# Patient Record
Sex: Female | Born: 1995 | Race: White | Hispanic: No | Marital: Single | Smoking: Never smoker
Health system: Southern US, Community
[De-identification: ages and names within clinical notes are randomized; demographics above are authoritative.]

## PROBLEM LIST (undated history)

## (undated) DIAGNOSIS — J45909 Unspecified asthma, uncomplicated: Secondary | ICD-10-CM

## (undated) DIAGNOSIS — K219 Gastro-esophageal reflux disease without esophagitis: Secondary | ICD-10-CM

## (undated) DIAGNOSIS — R519 Headache, unspecified: Secondary | ICD-10-CM

## (undated) HISTORY — PX: WISDOM TOOTH EXTRACTION: SHX21

---

## 2018-03-06 ENCOUNTER — Ambulatory Visit (HOSPITAL_COMMUNITY)
Admission: EM | Admit: 2018-03-06 | Discharge: 2018-03-06 | Disposition: A | Discharge status: Home / Self Care | Payer: Commercial Managed Care - PPO | Attending: Internal Medicine | Admitting: Internal Medicine

## 2018-03-06 ENCOUNTER — Encounter (HOSPITAL_COMMUNITY): Payer: Self-pay | Admitting: Emergency Medicine

## 2018-03-06 DIAGNOSIS — B349 Viral infection, unspecified: Secondary | ICD-10-CM | POA: Diagnosis not present

## 2018-03-06 MED ORDER — SODIUM CHLORIDE 0.9 % IV BOLUS
1000.0000 mL | Freq: Once | INTRAVENOUS | Status: AC
  Administered 2018-03-06: 1000 mL via INTRAVENOUS

## 2018-03-06 MED ORDER — BENZONATATE 100 MG PO CAPS: 100.0000 mg | ORAL_CAPSULE | Freq: Three times a day (TID) | ORAL | 0 refills | Status: DC

## 2018-03-06 MED ORDER — ONDANSETRON 4 MG PO TBDP: 4.0000 mg | ORAL_TABLET | Freq: Three times a day (TID) | ORAL | 0 refills | Status: DC | PRN

## 2018-03-06 NOTE — ED Triage Notes (Signed)
Pt c/o cough Monday, felt weak yesterday, vomited, running a fever since yesterday.

## 2018-03-06 NOTE — ED Provider Notes (Signed)
[facility]    CSN: [id number] Arrival date & time: 03/06/18  1022     History   Chief Complaint Chief Complaint  Patient presents with  . Flu Like Symptoms    HPI Cassandra Murray is a 23 y.o. female history of environmental allergies, mild intermittent asthma comes to the urgent care on account of 3-day history of runny nose, cough, nausea and vomiting.  Cough is productive of clear sputum.  No wheezing.  No relieving factors.  Patient has tried her inhalers with partial improvement.  It is associated with low-grade fever without chills.  Patient had nausea and vomiting.  No diarrhea.  Oral intake has been poor.  Patient feels generally fatigued and dizzy with activity.  HPI  Past medical history: Bronchial asthma Allergic rhinitis  There are no active problems to display for this patient.   History reviewed. No pertinent surgical history.  OB History   No obstetric history on file.      Home Medications    Prior to Admission medications   Medication Sig Start Date End Date Taking? Authorizing Provider  albuterol (PROVENTIL HFA;VENTOLIN HFA) 108 (90 Base) MCG/ACT inhaler Inhale into the lungs every 6 (six) hours as needed for wheezing or shortness of breath.   Yes [provider]  cetirizine-pseudoephedrine (ZYRTEC-D) 5-120 MG tablet Take 1 tablet by mouth 2 (two) times daily.   Yes [provider]  cyclobenzaprine (FLEXERIL) 10 MG tablet Take 10 mg by mouth 3 (three) times daily as needed for muscle spasms.   Yes [provider]  fluticasone (FLONASE) 50 MCG/ACT nasal spray Place into both nostrils daily.   Yes [provider]  meloxicam (MOBIC) 7.5 MG tablet Take 7.5 mg by mouth daily.   Yes [provider]    Family History Family History  Problem Relation Age of Onset  . Healthy Mother   . Healthy Father     Social History Social History   Tobacco Use  . Smoking status: Never Smoker  Substance Use  Topics  . Alcohol use: Not Currently  . Drug use: Never     Allergies   Patient has no known allergies.   Review of Systems Review of Systems  Constitutional: Positive for activity change, appetite change and fatigue. Negative for chills and fever.  HENT: Positive for congestion and sneezing. Negative for ear discharge, ear pain, rhinorrhea, sinus pressure, sinus pain, sore throat and voice change.   Eyes: Negative for pain, discharge and itching.  Respiratory: Positive for cough. Negative for chest tightness, shortness of breath and wheezing.   Cardiovascular: Negative for chest pain and palpitations.  Gastrointestinal: Positive for nausea and vomiting. Negative for abdominal distention and diarrhea.  Endocrine: Negative for polyuria.  Genitourinary: Negative for difficulty urinating, dysuria, frequency and urgency.  Musculoskeletal: Negative for arthralgias, back pain, joint swelling and neck pain.  Allergic/Immunologic: Positive for environmental allergies. Negative for food allergies and immunocompromised state.  Neurological: Positive for dizziness and light-headedness. Negative for syncope and numbness.  Hematological: Negative for adenopathy. Does not bruise/bleed easily.     Physical Exam Triage Vital Signs ED Triage Vitals  Enc Vitals Group     BP 03/06/18 1113 121/66     Pulse Rate 03/06/18 1112 (!) 118     Resp 03/06/18 1112 20     Temp 03/06/18 1112 99.6 F (37.6 C)     Temp src --      SpO2 03/06/18 1112 97 %     Weight --  Height --      Head Circumference --      Peak Flow --      Pain Score 03/06/18 1113 4     Pain Loc --      Pain Edu? --      Excl. in [place]? --    No data found.  Updated Vital Signs BP 121/66   Pulse (!) 118   Temp 99.6 F (37.6 C)   Resp 20   LMP 03/03/2018   SpO2 97%   Visual Acuity Right Eye Distance:   Left Eye Distance:   Bilateral Distance:    Right Eye Near:   Left Eye Near:    Bilateral Near:     Physical  Exam Constitutional:      Appearance: She is ill-appearing.  HENT:     Right Ear: Tympanic membrane normal. There is no impacted cerumen.     Left Ear: Tympanic membrane normal.     Nose: Nose normal.     Mouth/Throat:     Mouth: Mucous membranes are moist.     Pharynx: Posterior oropharyngeal erythema present. No oropharyngeal exudate.  Eyes:     Extraocular Movements: Extraocular movements intact.     Conjunctiva/sclera: Conjunctivae normal.     Pupils: Pupils are equal, round, and reactive to light.  Neck:     Musculoskeletal: Normal range of motion. No neck rigidity.  Cardiovascular:     Rate and Rhythm: Tachycardia present.     Heart sounds: No murmur. No gallop.   Pulmonary:     Effort: Pulmonary effort is normal. No respiratory distress.     Breath sounds: No wheezing.  Abdominal:     General: Abdomen is flat. Bowel sounds are normal.     Palpations: Abdomen is soft.  Musculoskeletal: Normal range of motion.  Skin:    General: Skin is warm.     Capillary Refill: Capillary refill takes less than 2 seconds.  Neurological:     General: No focal deficit present.     Mental Status: She is alert and oriented to person, place, and time.      UC Treatments / Results  Labs (all labs ordered are listed, but only abnormal results are displayed) Labs Reviewed - No data to display  EKG None  Radiology No results found.  Procedures Procedures (including critical care time)  Medications Ordered in UC Medications  sodium chloride 0.9 % bolus 1,000 mL (has no administration in time range)    Initial Impression / Assessment and Plan / UC Course  I have reviewed the triage vital signs and the nursing notes.  Pertinent labs & imaging results that were available during my care of the patient were reviewed by me and considered in my medical decision making (see chart for details).     1.  Viral syndrome with vomiting: Zofran ODT Encourage oral fluid intake   2.   Dizziness secondary to dehydration/orthostasis: Normal saline bolus-1 L Encourage oral fluid intake  3.  Mild intermittent asthma with mild exacerbation: Continue bronchodilator treatments No indication for steroid use at this type  Final Clinical Impressions(s) / UC Diagnoses   Final diagnoses:  None   Discharge Instructions   None    ED Prescriptions    None     Controlled Substance Prescriptions [place] Controlled Substance Registry consulted? No   Merrilee Jansky, MD 03/06/18 1148

## 2018-11-27 ENCOUNTER — Ambulatory Visit (HOSPITAL_COMMUNITY)
Admission: EM | Admit: 2018-11-27 | Discharge: 2018-11-27 | Disposition: A | Discharge status: Home / Self Care | Payer: Commercial Managed Care - PPO | Attending: Family Medicine | Admitting: Family Medicine

## 2018-11-27 ENCOUNTER — Ambulatory Visit (INDEPENDENT_AMBULATORY_CARE_PROVIDER_SITE_OTHER): Payer: Commercial Managed Care - PPO

## 2018-11-27 ENCOUNTER — Encounter (HOSPITAL_COMMUNITY): Payer: Self-pay | Admitting: Emergency Medicine

## 2018-11-27 ENCOUNTER — Other Ambulatory Visit: Payer: Self-pay

## 2018-11-27 DIAGNOSIS — M65832 Other synovitis and tenosynovitis, left forearm: Secondary | ICD-10-CM

## 2018-11-27 DIAGNOSIS — M25532 Pain in left wrist: Secondary | ICD-10-CM

## 2018-11-27 DIAGNOSIS — W19XXXA Unspecified fall, initial encounter: Secondary | ICD-10-CM

## 2018-11-27 MED ORDER — NAPROXEN 500 MG PO TABS: 500.0000 mg | ORAL_TABLET | Freq: Two times a day (BID) | ORAL | 0 refills | Status: DC

## 2018-11-27 MED ORDER — PREDNISONE 20 MG PO TABS: 20.0000 mg | ORAL_TABLET | Freq: Two times a day (BID) | ORAL | 0 refills | Status: DC

## 2018-11-27 NOTE — Discharge Instructions (Addendum)
The treatment of wrist tendinitis/tenosynovitis is rest, ice, and anti-inflammatory medications. Start with prednisone for the first 5 days.  Take twice a day with food.  Take 2 doses today After the prednisone take the naproxen 2 times a day with food.  This is the same medicine that spelled an over-the-counter Aleve, at a higher dose Wear brace at all times while active Follow-up if not improving in a week.  Talk to your employer about Worker's Comp. follow-up

## 2018-11-27 NOTE — ED Triage Notes (Signed)
Pt reports new onset pain in her left wrist that started on Sunday.  She states she gets shooting pains in her hand and up her forearm.  She denies any recent injury, but does report a fall a month ago where she broke her fall with that hand.  She had pain for a few days but that went away.

## 2018-11-27 NOTE — ED Provider Notes (Signed)
[facility]    CSN: [id number] Arrival date & time: 11/27/18  1201      History   Chief Complaint Chief Complaint  Patient presents with  . Wrist Pain    left    HPI Cassandra Murray is a 23 y.o. female.   HPI  Patient states that she fell at work about a month ago.  She fell backwards.  Landed on her outstretched left hand that was behind her.  Immediate wrist pain.  It persisted for several days.  She felt like it was getting better.  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  She has been able to do this.  She also works as a Theatre stage manager.  She states that on Sunday she woke up with a severe worsening of her pain.  It hurt with any movement.  Her with moving her fingers.  She does not know that she did anything differently.  She does not feel like she can do her serving work and carry trays at this time.  She is concerned she may have broken a bone in her wrist when she fell. States she is in good health.  Environmental allergies.  Not active at this time.   History reviewed. No pertinent past medical history.  There are no active problems to display for this patient.   History reviewed. No pertinent surgical history.  OB History   No obstetric history on file.      Home Medications    Prior to Admission medications   Medication Sig Start Date End Date Taking? Authorizing Provider  albuterol (PROVENTIL HFA;VENTOLIN HFA) 108 (90 Base) MCG/ACT inhaler Inhale into the lungs every 6 (six) hours as needed for wheezing or shortness of breath.    [provider]  fluticasone (FLONASE) 50 MCG/ACT nasal spray Place into both nostrils daily.    [provider]  naproxen (NAPROSYN) 500 MG tablet Take 1 tablet (500 mg total) by mouth 2 (two) times daily. 11/27/18   Eustace Moore, MD  predniSONE (DELTASONE) 20 MG tablet Take 1 tablet (20 mg total) by mouth 2  (two) times daily with a meal. 11/27/18   Eustace Moore, MD    Family History Family History  Problem Relation Age of Onset  . Healthy Mother   . Healthy Father     Social History Social History   Tobacco Use  . Smoking status: Never Smoker  . Smokeless tobacco: Never Used  Substance Use Topics  . Alcohol use: Not Currently  . Drug use: Never     Allergies   Patient has no known allergies.   Review of Systems Review of Systems  Constitutional: Negative for chills and fever.  HENT: Negative for ear pain and sore throat.   Eyes: Negative for pain and visual disturbance.  Respiratory: Negative for cough and shortness of breath.   Cardiovascular: Negative for chest pain and palpitations.  Gastrointestinal: Negative for abdominal pain and vomiting.  Genitourinary: Negative for dysuria and hematuria.  Musculoskeletal: Positive for arthralgias. Negative for back pain.  Skin: Negative for color change and rash.  Neurological: Negative for seizures and syncope.  All other systems reviewed and are negative.    Physical Exam Triage Vital Signs ED Triage Vitals  Enc Vitals Group     BP 11/27/18 1255 130/76     Pulse Rate 11/27/18 1255 68  Resp 11/27/18 1255 18     Temp 11/27/18 1255 99.1 F (37.3 C)     Temp Source 11/27/18 1255 Oral     SpO2 11/27/18 1255 100 %     Weight --      Height --      Head Circumference --      Peak Flow --      Pain Score 11/27/18 1256 8     Pain Loc --      Pain Edu? --      Excl. in [place]? --    No data found.  Updated Vital Signs BP 130/76 (BP Location: Right Arm)   Pulse 68   Temp 99.1 F (37.3 C) (Oral)   Resp 18   LMP 11/07/2018 (Approximate)   SpO2 100%     Physical Exam Constitutional:      General: She is not in acute distress.    Appearance: She is well-developed. She is obese.  HENT:     Head: Normocephalic and atraumatic.  Eyes:     Conjunctiva/sclera: Conjunctivae normal.     Pupils: Pupils are equal,  round, and reactive to light.  Neck:     Musculoskeletal: Normal range of motion.  Cardiovascular:     Rate and Rhythm: Normal rate.  Pulmonary:     Effort: Pulmonary effort is normal. No respiratory distress.  Abdominal:     General: There is no distension.     Palpations: Abdomen is soft.  Musculoskeletal: Normal range of motion.       Hands:  Skin:    General: Skin is warm and dry.  Neurological:     Mental Status: She is alert.  Psychiatric:        Mood and Affect: Mood normal.        Behavior: Behavior normal.      UC Treatments / Results  Labs (all labs ordered are listed, but only abnormal results are displayed) Labs Reviewed - No data to display  EKG   Radiology Dg Wrist Complete Left  Result Date: 11/27/2018 CLINICAL DATA:  Left wrist pain for the past 3 days. [place] a month ago. EXAM: LEFT WRIST - COMPLETE 3+ VIEW COMPARISON:  None. FINDINGS: There is no evidence of fracture or dislocation. There is no evidence of arthropathy or other focal bone abnormality. Soft tissues are unremarkable. IMPRESSION: Negative. Electronically Signed   By: Titus Dubin M.D.   On: 11/27/2018 13:33    Procedures Procedures (including critical care time)  Medications Ordered in UC Medications - No data to display  Initial Impression / Assessment and Plan / UC Course  I have reviewed the triage vital signs and the nursing notes.  Pertinent labs & imaging results that were available during my care of the patient were reviewed by me and considered in my medical decision making (see chart for details).    I do not fully understand why she had an injury, and then almost a 3-week interval with little pain, and severe worsening.  She does not recall overuse.  She will need to talk to her employer about Worker's Comp. and to follow-up.  Final Clinical Impressions(s) / UC Diagnoses   Final diagnoses:  Extensor tenosynovitis of left wrist  Acute pain of left wrist     Discharge  Instructions     The treatment of wrist tendinitis/tenosynovitis is rest, ice, and anti-inflammatory medications. Start with prednisone for the first 5 days.  Take twice a day with food.  Take  2 doses today After the prednisone take the naproxen 2 times a day with food.  This is the same medicine that spelled an over-the-counter Aleve, at a higher dose Wear brace at all times while active Follow-up if not improving in a week.  Talk to your employer about Worker's Comp. follow-up    ED Prescriptions    Medication Sig Dispense Auth. Provider   predniSONE (DELTASONE) 20 MG tablet Take 1 tablet (20 mg total) by mouth 2 (two) times daily with a meal. 10 tablet Eustace MooreNelson, Orella Cushman Sue, MD   naproxen (NAPROSYN) 500 MG tablet Take 1 tablet (500 mg total) by mouth 2 (two) times daily. 30 tablet Eustace MooreNelson, Skarlet Lyons Sue, MD     PDMP not reviewed this encounter.   Eustace MooreNelson, Alphons Burgert Sue, MD 11/27/18 [phone]

## 2020-09-13 IMAGING — DX DG WRIST COMPLETE 3+V*L*
4 series · 4 of 4 positions shown · non-contrast
Comparison: None.

CLINICAL DATA: Left wrist pain for the past 3 days. Fell a month
ago.

EXAM:
LEFT WRIST - COMPLETE 3+ VIEW

[wrist pa]
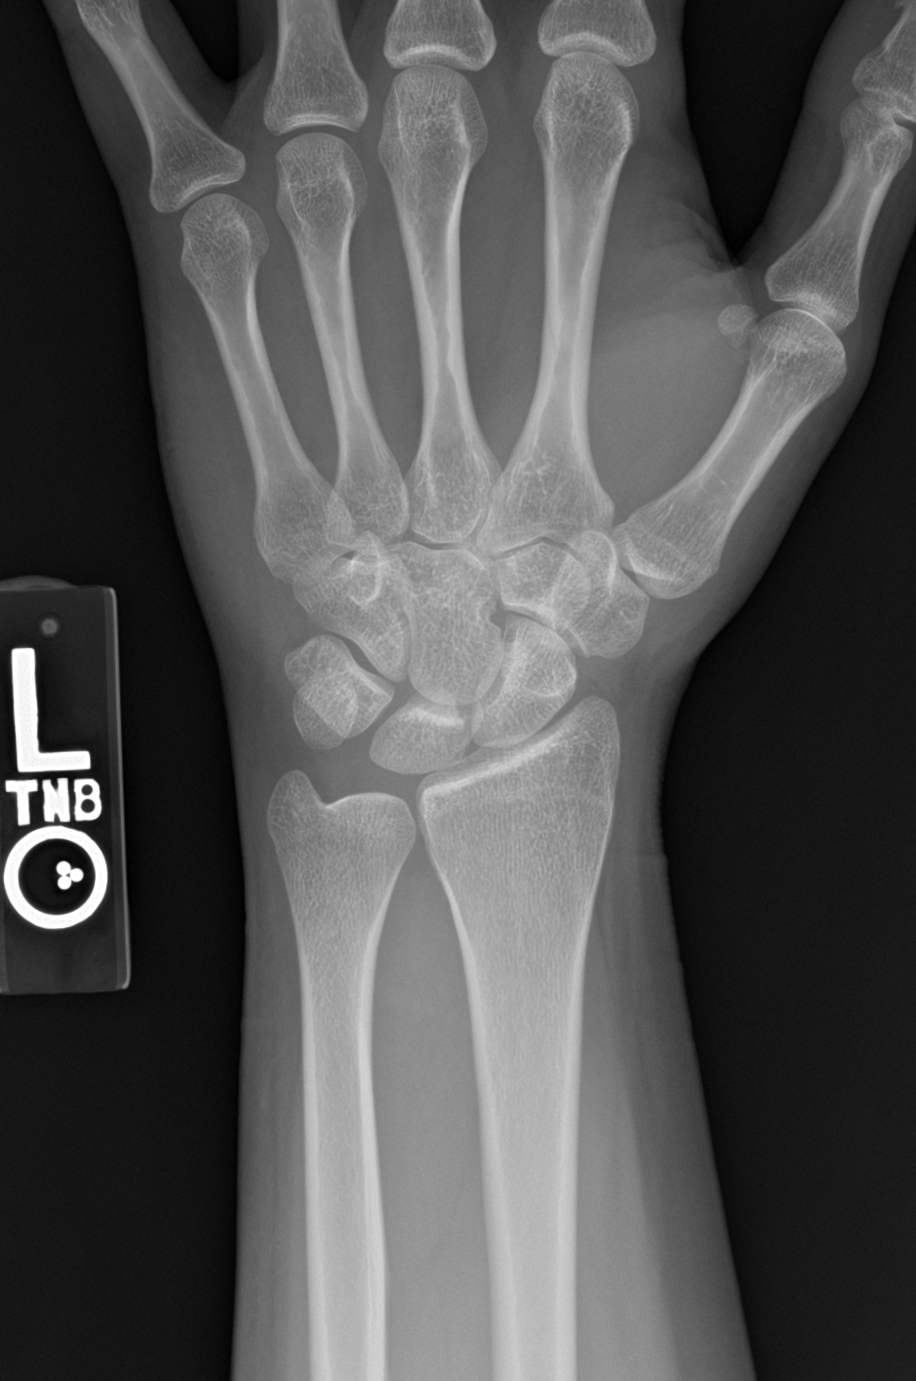

[wrist navicular]
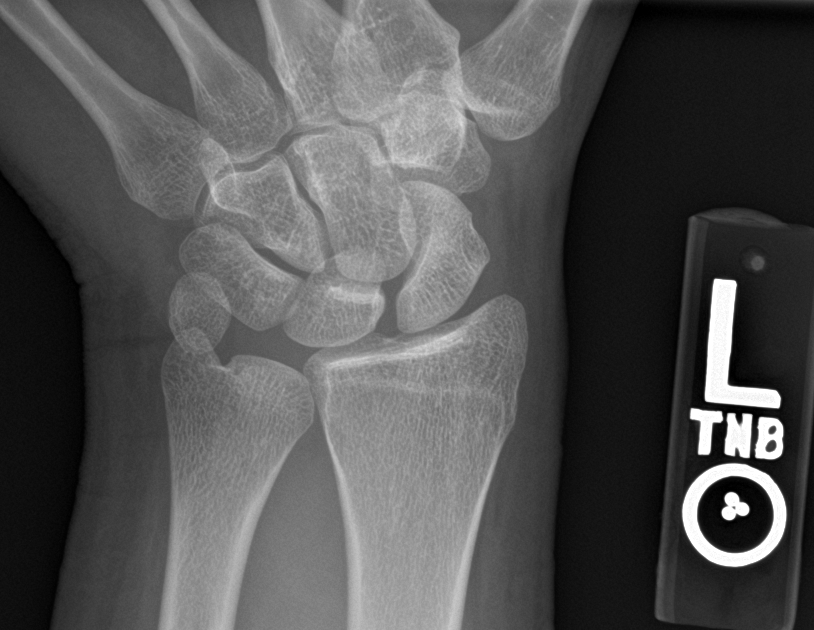

[wrist obl]
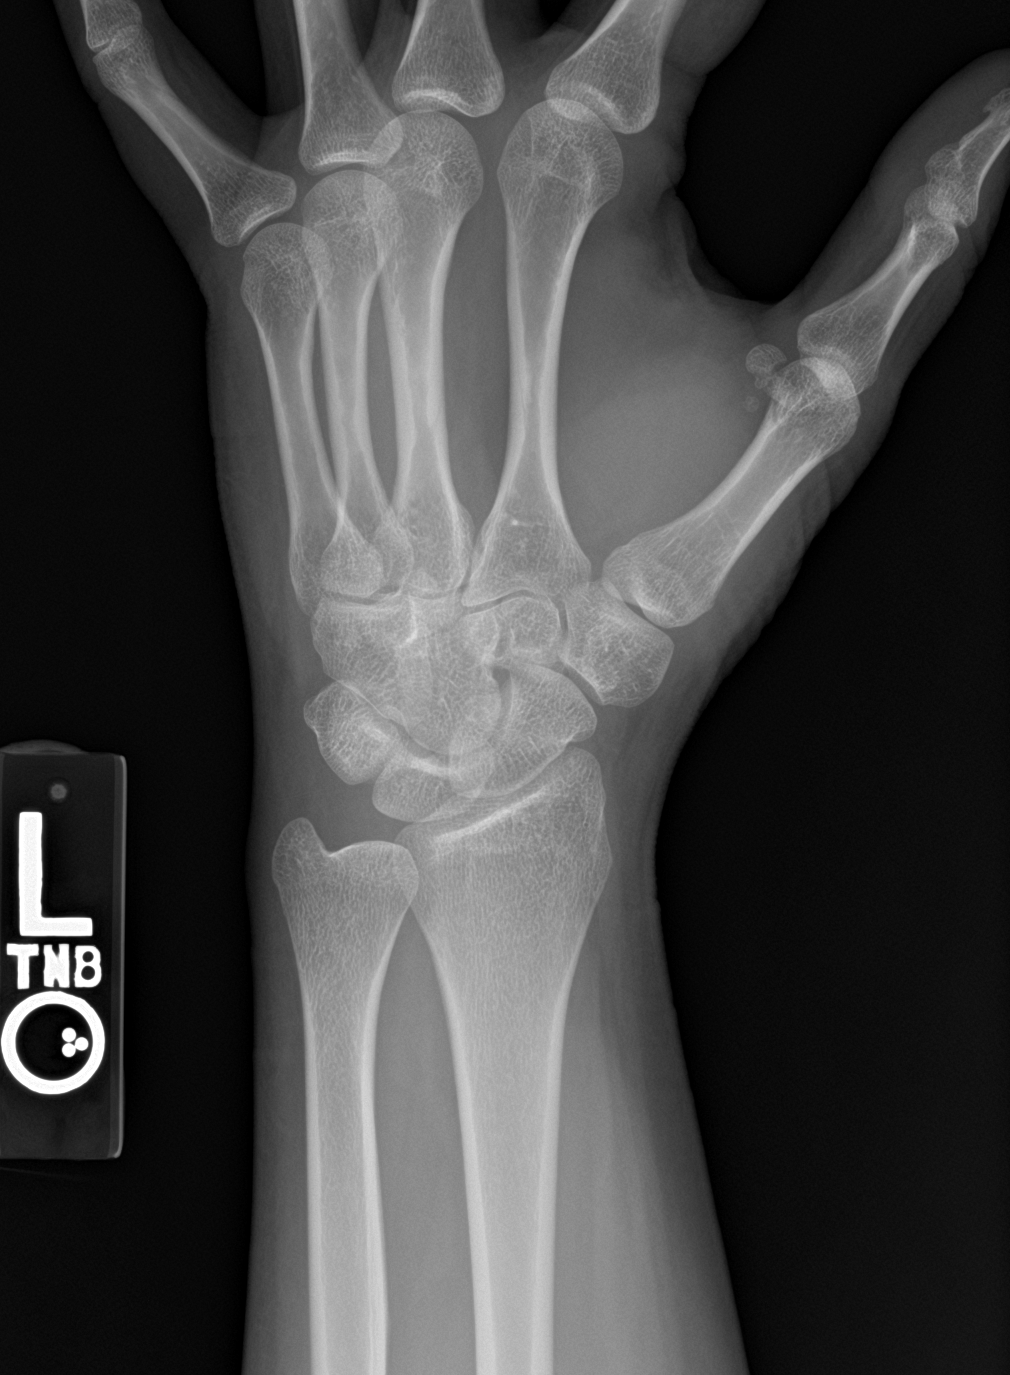

[wrist lat]
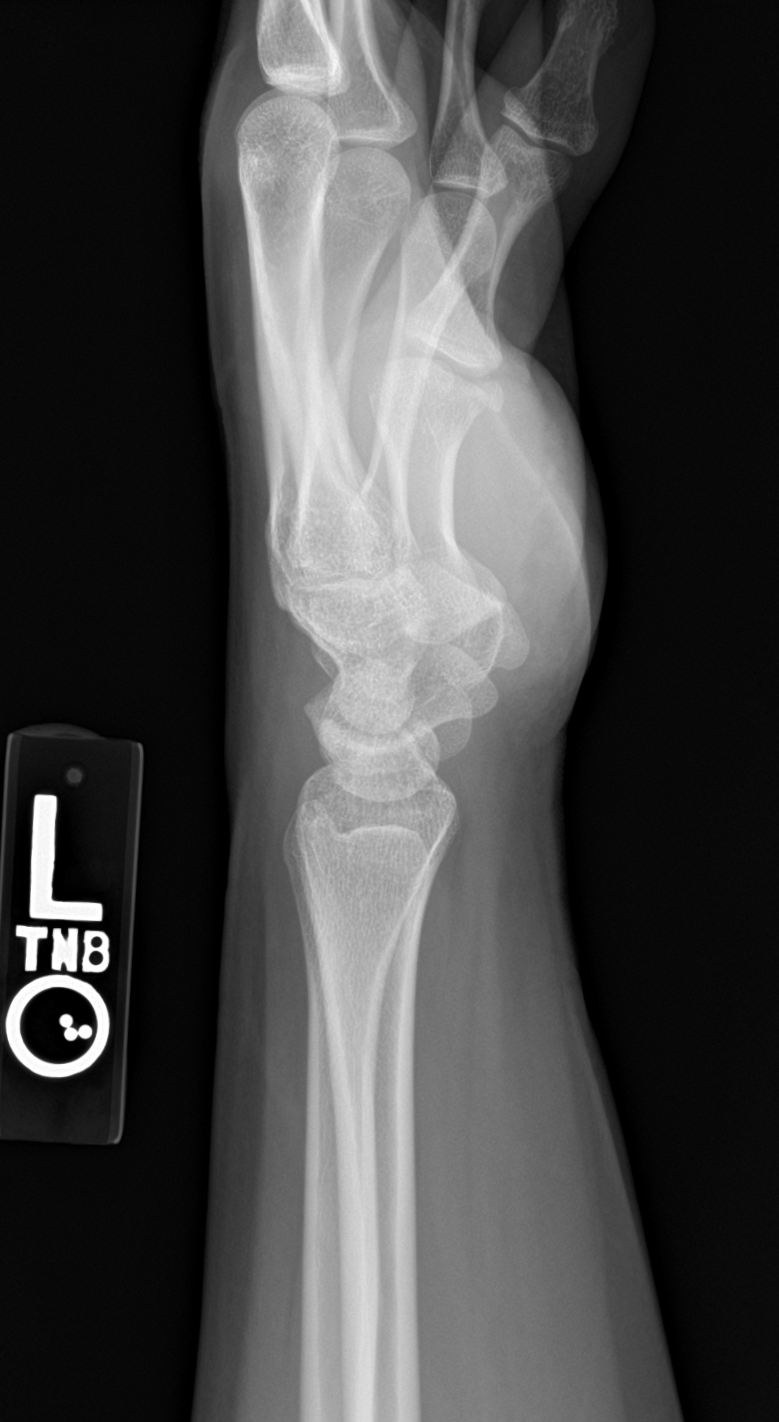

[4 of 4 positions shown; findings below may reference images not displayed]

FINDINGS: There is no evidence of fracture or dislocation. There is no
evidence of arthropathy or other focal bone abnormality. Soft
tissues are unremarkable.
IMPRESSION: Negative.

## 2023-08-27 ENCOUNTER — Other Ambulatory Visit: Payer: Self-pay

## 2023-08-27 ENCOUNTER — Encounter (HOSPITAL_COMMUNITY): Payer: Self-pay

## 2023-08-27 ENCOUNTER — Emergency Department (HOSPITAL_COMMUNITY)
Admission: EM | Admit: 2023-08-27 | Discharge: 2023-08-28 | Disposition: A | Discharge status: Home / Self Care | Attending: Emergency Medicine | Admitting: Emergency Medicine

## 2023-08-27 DIAGNOSIS — K8020 Calculus of gallbladder without cholecystitis without obstruction: Secondary | ICD-10-CM | POA: Diagnosis not present

## 2023-08-27 DIAGNOSIS — R1013 Epigastric pain: Secondary | ICD-10-CM | POA: Diagnosis present

## 2023-08-27 LAB — HCG, SERUM, QUALITATIVE: Preg, Serum: NEGATIVE

## 2023-08-27 LAB — CBC
HCT: 43.5 % (ref 36.0–46.0)
Hemoglobin: 14.5 g/dL (ref 12.0–15.0)
MCH: 29.4 pg (ref 26.0–34.0)
MCHC: 33.3 g/dL (ref 30.0–36.0)
MCV: 88.1 fL (ref 80.0–100.0)
Platelets: 315 K/uL (ref 150–400)
RBC: 4.94 MIL/uL (ref 3.87–5.11)
RDW: 12.3 % (ref 11.5–15.5)
WBC: 8.6 K/uL (ref 4.0–10.5)
nRBC: 0 % (ref 0.0–0.2)

## 2023-08-27 LAB — LIPASE, BLOOD: Lipase: 48 U/L (ref 11–51)

## 2023-08-27 LAB — COMPREHENSIVE METABOLIC PANEL WITH GFR
ALT: 63 U/L — ABNORMAL HIGH (ref 0–44)
AST: 43 U/L — ABNORMAL HIGH (ref 15–41)
Albumin: 3.9 g/dL (ref 3.5–5.0)
Alkaline Phosphatase: 82 U/L (ref 38–126)
Anion gap: 9 (ref 5–15)
BUN: 17 mg/dL (ref 6–20)
CO2: 25 mmol/L (ref 22–32)
Calcium: 9.3 mg/dL (ref 8.9–10.3)
Chloride: 104 mmol/L (ref 98–111)
Creatinine, Ser: 0.73 mg/dL (ref 0.44–1.00)
GFR, Estimated: >60 mL/min (ref >60)
Glucose, Bld: 113 mg/dL — ABNORMAL HIGH (ref 70–99)
Potassium: 4.2 mmol/L (ref 3.5–5.1)
Sodium: 138 mmol/L (ref 135–145)
Total Bilirubin: 0.7 mg/dL (ref 0.0–1.2)
Total Protein: 8.4 g/dL — ABNORMAL HIGH (ref 6.5–8.1)

## 2023-08-27 MED ORDER — ONDANSETRON 4 MG PO TBDP
4.0000 mg | ORAL_TABLET | Freq: Once | ORAL | Status: AC | PRN
  Administered 2023-08-27: 4 mg via ORAL
  Filled 2023-08-27: qty 1

## 2023-08-27 MED ORDER — ONDANSETRON HCL 4 MG/2ML IJ SOLN
4.0000 mg | Freq: Once | INTRAMUSCULAR | Status: AC
  Administered 2023-08-28: 4 mg via INTRAVENOUS
  Filled 2023-08-27: qty 2

## 2023-08-27 MED ORDER — LACTATED RINGERS IV BOLUS
1000.0000 mL | Freq: Once | INTRAVENOUS | Status: AC
  Administered 2023-08-28: 1000 mL via INTRAVENOUS

## 2023-08-27 MED ORDER — HYDROMORPHONE HCL 1 MG/ML IJ SOLN
0.5000 mg | Freq: Once | INTRAMUSCULAR | Status: AC
  Administered 2023-08-28: 0.5 mg via INTRAVENOUS
  Filled 2023-08-27: qty 1

## 2023-08-27 NOTE — ED Triage Notes (Addendum)
 Pt came in for lower diaphragm/upper abdominal pain that started 45 minutes ago. Pt stated it's more in her diaphragm then abdomen. Pt has been throwing up since being here and states she has had no trauma or issues that could've caused it.

## 2023-08-27 NOTE — ED Provider Notes (Signed)
 [facility] Emergency Department Provider Note MRN:  [id number]  Arrival date & time: 08/28/23     Chief Complaint   Abdominal Pain   History of Present Illness   Cassandra Murray is a 28 y.o. year-old female presents to the ED with chief complaint of epigastric abdominal pain that radiated around the side.  States that the pain started tonight.  States that it waxes and wanes in severity.  States that she has had associated nausea and vomiting.  States that it was sudden in onset.  Denies any ETOH use.  Denies any prior abdominal surgeries.  Rates her pain as a 2/10 at present.  Denies fever or chills. Denies any other associated symptoms.  Hasn't tried taking anything.  SABRA  History provided by patient.   Review of Systems  Pertinent positive and negative review of systems noted in HPI.    Physical Exam   Vitals:   08/27/23 2116 08/28/23 0042  BP: 135/88 122/77  Pulse: 85 68  Resp: 20 19  Temp: 98.1 F (36.7 C) 98.6 F (37 C)  SpO2: 100% 100%    CONSTITUTIONAL:  non toxic-appearing, NAD NEURO:  Alert and oriented x 3, CN 3-12 grossly intact EYES:  eyes equal and reactive ENT/NECK:  Supple, no stridor  CARDIO:  normal rate, regular rhythm, appears well-perfused  PULM:  No respiratory distress, CTAB GI/GU:  non-distended, mild RUQ abdominal tenderness MSK/SPINE:  No gross deformities, no edema, moves all extremities  SKIN:  no rash, atraumatic   *Additional and/or pertinent findings included in MDM below  Diagnostic and Interventional Summary    EKG Interpretation Date/Time:    Ventricular Rate:    PR Interval:    QRS Duration:    QT Interval:    QTC Calculation:   R Axis:      Text Interpretation:         Labs Reviewed  COMPREHENSIVE METABOLIC PANEL WITH GFR - Abnormal; Notable for the following components:      Result Value   Glucose, Bld 113 (*)    Total Protein 8.4 (*)    AST 43 (*)    ALT 63 (*)    All other components within  normal limits  LIPASE, BLOOD  CBC  URINALYSIS, ROUTINE W REFLEX MICROSCOPIC  HCG, SERUM, QUALITATIVE    US  Abdomen Limited RUQ (LIVER/GB)  Final Result      Medications  oxyCODONE -acetaminophen  (PERCOCET/ROXICET) 5-325 MG per tablet 1 tablet (has no administration in time range)  ondansetron  (ZOFRAN -ODT) disintegrating tablet 4 mg (4 mg Oral Given 08/27/23 2140)  HYDROmorphone  (DILAUDID ) injection 0.5 mg (0.5 mg Intravenous Given 08/28/23 0001)  ondansetron  (ZOFRAN ) injection 4 mg (4 mg Intravenous Given 08/28/23 0001)  lactated ringers  bolus 1,000 mL (1,000 mLs Intravenous Bolus 08/28/23 0002)     Procedures  /  Critical Care Procedures  ED Course and Medical Decision Making  I have reviewed the triage vital signs, the nursing notes, and pertinent available records from the EMR.  Social Determinants Affecting Complexity of Care: Patient has no clinically significant social determinants affecting this chief complaint..   ED Course:    Medical Decision Making Patient here with right upper quadrant abdominal pain that radiates into her back.  Onset was suddenly tonight.  She states that she had associated nausea and vomiting.  She states that her pain has now significantly improved.  Reports that she has 2 out of 10 pain now.  Labs are fairly reassuring.  She has mildly  elevated LFTs.  Will check right upper quadrant ultrasound.  Ultrasound notable for gallstones, but no evidence of cholecystitis.  Patient remains well pain controlled.  I do not think that she requires admission or surgical consultation at this time.  Vital signs are stable.  She is afebrile.  She is tolerating oral intake.  Will plan for discharge home with some Percocet and Zofran .  Return precautions discussed.  I have encouraged the patient to follow-up with general surgery.  Amount and/or Complexity of Data Reviewed Labs: ordered. Radiology: ordered.  Risk Prescription drug management.          Consultants: No consultations were needed in caring for this patient.   Treatment and Plan: I considered admission due to patient's initial presentation, but after considering the examination and diagnostic results, patient will not require admission and can be discharged with outpatient follow-up.    Final Clinical Impressions(s) / ED Diagnoses     ICD-10-CM   1. Calculus of gallbladder without cholecystitis without obstruction  K80.20       ED Discharge Orders          Ordered    oxyCODONE -acetaminophen  (PERCOCET/ROXICET) 5-325 MG tablet  Every 6 hours PRN        08/28/23 0200    ondansetron  (ZOFRAN -ODT) 4 MG disintegrating tablet  Every 8 hours PRN        08/28/23 0200              Discharge Instructions Discussed with and Provided to Patient:   Discharge Instructions   None      Vicky Charleston, PA-C 08/28/23 9780    Griselda Norris, MD 08/28/23 [phone]

## 2023-08-28 ENCOUNTER — Emergency Department (HOSPITAL_COMMUNITY)

## 2023-08-28 LAB — URINALYSIS, ROUTINE W REFLEX MICROSCOPIC
Bilirubin Urine: NEGATIVE
Glucose, UA: NEGATIVE mg/dL
Hgb urine dipstick: NEGATIVE
Ketones, ur: NEGATIVE mg/dL
Leukocytes,Ua: NEGATIVE
Nitrite: NEGATIVE
Protein, ur: NEGATIVE mg/dL
Specific Gravity, Urine: 1.012 (ref 1.005–1.030)
pH: 6 (ref 5.0–8.0)

## 2023-08-28 MED ORDER — OXYCODONE-ACETAMINOPHEN 5-325 MG PO TABS
1.0000 | ORAL_TABLET | Freq: Once | ORAL | Status: AC
  Administered 2023-08-28: 1 via ORAL
  Filled 2023-08-28: qty 1

## 2023-08-28 MED ORDER — ONDANSETRON 4 MG PO TBDP: 4.0000 mg | ORAL_TABLET | Freq: Three times a day (TID) | ORAL | 0 refills | Status: AC | PRN

## 2023-08-28 MED ORDER — OXYCODONE-ACETAMINOPHEN 5-325 MG PO TABS: 1.0000 | ORAL_TABLET | Freq: Four times a day (QID) | ORAL | 0 refills | Status: AC | PRN

## 2023-09-20 NOTE — Progress Notes (Signed)
 REFERRING PHYSICIAN:  Griselda Almarie Caldron, MD PROVIDER:  DONNICE CARLIN BURY, MD MRN: [id number] DOB: 1995-08-28 DATE OF ENCOUNTER: 09/20/2023 Subjective    Chief Complaint: New Consultation (Calculus of gallbladder w/o cholecystitis w/o obstruction,)   History of Present Illness: 6 yof otherwise healthy presented to the emergency room earlier this month for her first episode of upper abdominal pain radiating to her back.  This is associated with some heartburn as well as emesis.  She had some constipation associated with that as well.  She underwent evaluation and had normal laboratory evaluation and she had an ultrasound that showed a 1.9 cm gallstone without any evidence of acute cholecystitis.  She was discharged home from the emergency room.  She has had 1 more episode related to what she ate for dinner since then but is otherwise doing okay.  She comes in today to discuss plan    Review of Systems: A complete review of systems was obtained from the patient.  I have reviewed this information and discussed as appropriate with the patient.  See HPI as well for other ROS.  Review of Systems  HENT:  Positive for congestion.   Respiratory:  Positive for cough.   Gastrointestinal:  Positive for abdominal pain, heartburn, nausea and vomiting.  Psychiatric/Behavioral:  The patient is nervous/anxious.   All other systems reviewed and are negative.    Medical History: Past Medical History:  Diagnosis Date  . Asthma, unspecified asthma severity, unspecified whether complicated, unspecified whether persistent ([facility])     There is no problem list on file for this patient.   History reviewed. No pertinent surgical history.   No Known Allergies  Current Outpatient Medications on File Prior to Visit  Medication Sig Dispense Refill  . albuterol MDI, PROVENTIL, VENTOLIN, PROAIR, HFA 90 mcg/actuation inhaler Inhale into the lungs    . budesonide-formoteroL (SYMBICORT) 80-4.5  mcg/actuation inhaler     . cetirizine (ZYRTEC) 10 MG tablet     . fluticasone propionate (FLONASE) 50 mcg/actuation nasal spray Place into one nostril    . ketotifen (ZADITOR) 0.025 % (0.035 %) ophthalmic solution     . naproxen  sodium 220 mg Cap     . ondansetron  (ZOFRAN -ODT) 4 MG disintegrating tablet Take 4 mg by mouth every 8 (eight) hours as needed    . oxyCODONE -acetaminophen  (PERCOCET) 5-325 mg tablet Take 1-2 tablets by mouth     No current facility-administered medications on file prior to visit.    Family History  Problem Relation Age of Onset  . Obesity Mother   . Hyperlipidemia (Elevated cholesterol) Mother      Social History   Tobacco Use  Smoking Status Never  Smokeless Tobacco Never     Social History   Socioeconomic History  . Marital status: Single  Tobacco Use  . Smoking status: Never  . Smokeless tobacco: Never  Vaping Use  . Vaping status: Unknown  Substance and Sexual Activity  . Alcohol use: Never  . Drug use: Never   Social Drivers of Health   Housing Stability: Unknown (09/20/2023)   Housing Stability Vital Sign   . Homeless in the Last Year: No    Objective:   Vitals:   09/20/23 0913  BP: 112/76  Pulse: 87  Resp: 16  Temp: 36.8 C (98.2 F)  SpO2: 98%  Weight: (!) 120.8 kg (266 lb 6.4 oz)  Height: 177.8 cm (5' 10)  PainSc:   8  PainLoc: Abdomen    Body mass index is  38.22 kg/m.  Physical Exam Vitals reviewed.  Constitutional:      Appearance: Normal appearance.  Eyes:     General: No scleral icterus. Pulmonary:     Effort: Pulmonary effort is normal.  Abdominal:     General: There is no distension.     Palpations: Abdomen is soft.     Tenderness: There is no abdominal tenderness.  Neurological:     Mental Status: She is alert.        Assessment and Plan:     Diagnoses and all orders for this visit:  Calculus of gallbladder without cholecystitis without obstruction    Lap chole  I do think symptoms are  related to her gb. I discussed the procedure in detail.  We discussed the risks and benefits of a laparoscopic cholecystectomy including, but not limited to bleeding, infection, injury to surrounding structures such as the intestine or liver, bile leak, retained gallstones, need to convert to an open procedure, prolonged diarrhea, blood clots such as  DVT, common bile duct injury, anesthesia risks, and possible need for additional procedures. We also discussed small risk of subtotal cholecystectomy.  The likelihood of improvement in symptoms and return to the patient's normal status is good. We discussed the typical post-operative recovery course.    MATTHEW CARLIN BURY, MD

## 2023-10-17 ENCOUNTER — Encounter (HOSPITAL_COMMUNITY): Payer: Self-pay | Admitting: General Surgery

## 2023-10-17 ENCOUNTER — Other Ambulatory Visit: Payer: Self-pay | Admitting: General Surgery

## 2023-10-17 ENCOUNTER — Other Ambulatory Visit: Payer: Self-pay

## 2023-10-17 NOTE — Pre-Procedure Instructions (Signed)
 SDW CALL  Patient was given pre-op instructions over the phone. The opportunity was given for the patient to ask questions. No further questions asked. Patient verbalized understanding of instructions given.   PCP - Georgia Norleen FALCON., MD- MD is currently retired- has not seen a PCP in 5 years Cardiologist - denies  PPM/ICD - denies   Chest x-ray - denies EKG - denies Stress Test - denies ECHO - denies Cardiac Cath - denies  Sleep Study - denies   Fasting Blood Sugar - denies  Blood Thinner Instructions: none   ERAS Protcol - ERAS  COVID TEST- n/a  Asthma- uses inhaler with exercise, extreme heat and cold.   Anesthesia review: no  Patient denies shortness of breath, fever, cough and chest pain over the phone call    Surgical Instructions    Your procedure is scheduled on October 18, 2023  Report to [facility] Main Entrance A at 0530 A.M., then check in with the Admitting office.  Call this number if you have problems the morning of surgery:  [phone]    Remember:  Do not eat after midnight the night before your surgery  You may drink clear liquids until 430AM the morning of your surgery.   Clear liquids allowed are: Water, Non-Citrus Juices (without pulp), Carbonated Beverages, Clear Tea, Black Coffee ONLY (no honey, NO MILK, CREAM OR POWDERED CREAMER of any kind), and Gatorade   Take these medicines the morning of surgery with A SIP OF WATER:  cetirizine (ZYRTEC)  fluticasone (FLONASE) 50 MCG/ACT nasal spray   If needed: ondansetron  (ZOFRAN -ODT)  oxyCODONE -acetaminophen  (PERCOCET/ROXICET)  albuterol (PROVENTIL HFA;VENTOLIN HFA) 108 (90 Base) MCG/ACT inhaler  budesonide-formoterol (SYMBICORT) 80-4.5 MCG/ACT inhaler   Bring your inhalers to the hospital with you.   As of today, STOP taking any Aspirin (unless otherwise instructed by your surgeon) Aleve , Naproxen , Ibuprofen, Motrin, Advil, Goody's, BC's, all herbal medications, fish oil, and all  vitamins.  [facility] is not responsible for any belongings or valuables.   Contacts, glasses, hearing aids, dentures or partials may not be worn into surgery, please bring cases for these belongings   Patients discharged the day of surgery will not be allowed to drive home, and someone needs to stay with them for 24 hours.   SURGICAL WAITING ROOM VISITATION You may have 1 visitor in the pre-op area at a time determined by the pre-op nurse. (Visitor may not switch out) Patients having surgery or a procedure in a hospital may have two support people in the waiting room. Children under the age of 30 must have an adult with them who is not the patient.    Day of Surgery:  Take a shower the day of or night before with antibacterial soap. Wear Clean/Comfortable clothing the morning of surgery Do not apply any deodorants/lotions.   Do not wear jewelry or makeup Do not wear lotions, powders, perfumes/colognes, or deodorant. Do not shave 48 hours prior to surgery. Do not bring valuables to the hospital. Do not wear nail polish, gel polish, artificial nails, or any other type of covering on natural nails (fingers and toes)  Remember to brush your teeth WITH YOUR REGULAR TOOTHPASTE.

## 2023-10-18 ENCOUNTER — Ambulatory Visit (HOSPITAL_COMMUNITY): Payer: Self-pay | Admitting: Anesthesiology

## 2023-10-18 ENCOUNTER — Other Ambulatory Visit: Payer: Self-pay

## 2023-10-18 ENCOUNTER — Ambulatory Visit (HOSPITAL_COMMUNITY)
Admission: RE | Admit: 2023-10-18 | Discharge: 2023-10-18 | Disposition: A | Discharge status: Home / Self Care | Attending: General Surgery | Admitting: General Surgery

## 2023-10-18 ENCOUNTER — Encounter (HOSPITAL_COMMUNITY)
Admission: RE | Disposition: A | Discharge status: Home / Self Care | Payer: Self-pay | Source: Home / Self Care | Attending: General Surgery

## 2023-10-18 ENCOUNTER — Ambulatory Visit (HOSPITAL_BASED_OUTPATIENT_CLINIC_OR_DEPARTMENT_OTHER): Payer: Self-pay | Admitting: Anesthesiology

## 2023-10-18 DIAGNOSIS — K219 Gastro-esophageal reflux disease without esophagitis: Secondary | ICD-10-CM | POA: Insufficient documentation

## 2023-10-18 DIAGNOSIS — K8010 Calculus of gallbladder with chronic cholecystitis without obstruction: Secondary | ICD-10-CM | POA: Diagnosis not present

## 2023-10-18 DIAGNOSIS — J45909 Unspecified asthma, uncomplicated: Secondary | ICD-10-CM | POA: Diagnosis not present

## 2023-10-18 DIAGNOSIS — K8020 Calculus of gallbladder without cholecystitis without obstruction: Secondary | ICD-10-CM | POA: Diagnosis present

## 2023-10-18 HISTORY — DX: Headache, unspecified: R51.9

## 2023-10-18 HISTORY — PX: CHOLECYSTECTOMY: SHX55

## 2023-10-18 HISTORY — DX: Unspecified asthma, uncomplicated: J45.909

## 2023-10-18 HISTORY — DX: Gastro-esophageal reflux disease without esophagitis: K21.9

## 2023-10-18 LAB — CBC
HCT: 37.8 % (ref 36.0–46.0)
Hemoglobin: 13.1 g/dL (ref 12.0–15.0)
MCH: 30 pg (ref 26.0–34.0)
MCHC: 34.7 g/dL (ref 30.0–36.0)
MCV: 86.7 fL (ref 80.0–100.0)
Platelets: 292 K/uL (ref 150–400)
RBC: 4.36 MIL/uL (ref 3.87–5.11)
RDW: 11.9 % (ref 11.5–15.5)
WBC: 6.8 K/uL (ref 4.0–10.5)
nRBC: 0 % (ref 0.0–0.2)

## 2023-10-18 LAB — POCT PREGNANCY, URINE: Preg Test, Ur: NEGATIVE

## 2023-10-18 SURGERY — LAPAROSCOPIC CHOLECYSTECTOMY
Anesthesia: General | Site: Abdomen

## 2023-10-18 MED ORDER — OXYCODONE HCL 5 MG PO TABS
5.0000 mg | ORAL_TABLET | Freq: Once | ORAL | Status: AC | PRN
  Administered 2023-10-18: 5 mg via ORAL

## 2023-10-18 MED ORDER — CHLORHEXIDINE GLUCONATE CLOTH 2 % EX PADS: 6.0000 | MEDICATED_PAD | Freq: Once | CUTANEOUS | Status: DC

## 2023-10-18 MED ORDER — PROPOFOL 10 MG/ML IV BOLUS
INTRAVENOUS | Status: AC
  Filled 2023-10-18: qty 20

## 2023-10-18 MED ORDER — INDOCYANINE GREEN 25 MG IV SOLR
1.2500 mg | Freq: Once | INTRAVENOUS | Status: AC
  Administered 2023-10-18: 1.25 mg via INTRAVENOUS

## 2023-10-18 MED ORDER — DEXAMETHASONE SODIUM PHOSPHATE 10 MG/ML IJ SOLN
INTRAMUSCULAR | Status: DC | PRN
  Administered 2023-10-18: 10 mg via INTRAVENOUS

## 2023-10-18 MED ORDER — DROPERIDOL 2.5 MG/ML IJ SOLN
0.6250 mg | Freq: Once | INTRAMUSCULAR | Status: AC | PRN
  Administered 2023-10-18: 0.625 mg via INTRAVENOUS

## 2023-10-18 MED ORDER — SUGAMMADEX SODIUM 200 MG/2ML IV SOLN
INTRAVENOUS | Status: DC | PRN
Start: 2023-10-18 — End: 2023-10-18
  Administered 2023-10-18: 100 mg via INTRAVENOUS
  Administered 2023-10-18: 200 mg via INTRAVENOUS

## 2023-10-18 MED ORDER — FENTANYL CITRATE (PF) 250 MCG/5ML IJ SOLN
INTRAMUSCULAR | Status: AC
  Filled 2023-10-18: qty 5

## 2023-10-18 MED ORDER — ENSURE PRE-SURGERY PO LIQD: 296.0000 mL | Freq: Once | ORAL | Status: DC

## 2023-10-18 MED ORDER — OXYCODONE HCL 5 MG PO TABS: 5.0000 mg | ORAL_TABLET | Freq: Four times a day (QID) | ORAL | 0 refills | Status: AC | PRN

## 2023-10-18 MED ORDER — ACETAMINOPHEN 10 MG/ML IV SOLN: 1000.0000 mg | Freq: Once | INTRAVENOUS | Status: DC | PRN

## 2023-10-18 MED ORDER — ROCURONIUM BROMIDE 10 MG/ML (PF) SYRINGE
PREFILLED_SYRINGE | INTRAVENOUS | Status: AC
  Filled 2023-10-18: qty 10

## 2023-10-18 MED ORDER — ORAL CARE MOUTH RINSE: 15.0000 mL | Freq: Once | OROMUCOSAL | Status: AC

## 2023-10-18 MED ORDER — ONDANSETRON HCL 4 MG/2ML IJ SOLN
INTRAMUSCULAR | Status: DC | PRN
  Administered 2023-10-18: 4 mg via INTRAVENOUS

## 2023-10-18 MED ORDER — DEXAMETHASONE SODIUM PHOSPHATE 10 MG/ML IJ SOLN
INTRAMUSCULAR | Status: AC
  Filled 2023-10-18: qty 1

## 2023-10-18 MED ORDER — MIDAZOLAM HCL 2 MG/2ML IJ SOLN
INTRAMUSCULAR | Status: AC
  Filled 2023-10-18: qty 2

## 2023-10-18 MED ORDER — LIDOCAINE 2% (20 MG/ML) 5 ML SYRINGE
INTRAMUSCULAR | Status: DC | PRN
  Administered 2023-10-18: 40 mg via INTRAVENOUS

## 2023-10-18 MED ORDER — PROPOFOL 10 MG/ML IV BOLUS
INTRAVENOUS | Status: DC | PRN
  Administered 2023-10-18: 200 mg via INTRAVENOUS

## 2023-10-18 MED ORDER — OXYCODONE HCL 5 MG PO TABS
ORAL_TABLET | ORAL | Status: AC
  Filled 2023-10-18: qty 1

## 2023-10-18 MED ORDER — BUPIVACAINE-EPINEPHRINE (PF) 0.25% -1:200000 IJ SOLN
INTRAMUSCULAR | Status: DC | PRN
Start: 2023-10-18 — End: 2023-10-18
  Administered 2023-10-18 (×2): 30 mL

## 2023-10-18 MED ORDER — LIDOCAINE 2% (20 MG/ML) 5 ML SYRINGE
INTRAMUSCULAR | Status: AC
  Filled 2023-10-18: qty 5

## 2023-10-18 MED ORDER — HEMOSTATIC AGENTS (NO CHARGE) OPTIME
TOPICAL | Status: DC | PRN
  Administered 2023-10-18 (×2): 1 via TOPICAL

## 2023-10-18 MED ORDER — LACTATED RINGERS IV SOLN: INTRAVENOUS | Status: DC

## 2023-10-18 MED ORDER — ONDANSETRON HCL 4 MG/2ML IJ SOLN
INTRAMUSCULAR | Status: AC
  Filled 2023-10-18: qty 2

## 2023-10-18 MED ORDER — KETOROLAC TROMETHAMINE 30 MG/ML IJ SOLN
INTRAMUSCULAR | Status: AC
  Filled 2023-10-18: qty 1

## 2023-10-18 MED ORDER — ROCURONIUM BROMIDE 10 MG/ML (PF) SYRINGE
PREFILLED_SYRINGE | INTRAVENOUS | Status: DC | PRN
  Administered 2023-10-18: 60 mg via INTRAVENOUS

## 2023-10-18 MED ORDER — FENTANYL CITRATE (PF) 100 MCG/2ML IJ SOLN: 25.0000 ug | INTRAMUSCULAR | Status: DC | PRN

## 2023-10-18 MED ORDER — KETOROLAC TROMETHAMINE 30 MG/ML IJ SOLN
INTRAMUSCULAR | Status: DC | PRN
  Administered 2023-10-18: 30 mg via INTRAVENOUS

## 2023-10-18 MED ORDER — FENTANYL CITRATE (PF) 250 MCG/5ML IJ SOLN
INTRAMUSCULAR | Status: DC | PRN
  Administered 2023-10-18: 50 ug via INTRAVENOUS
  Administered 2023-10-18: 100 ug via INTRAVENOUS
  Administered 2023-10-18 (×2): 50 ug via INTRAVENOUS

## 2023-10-18 MED ORDER — BUPIVACAINE-EPINEPHRINE 0.25% -1:200000 IJ SOLN
INTRAMUSCULAR | Status: DC | PRN
  Administered 2023-10-18: 10 mL

## 2023-10-18 MED ORDER — SODIUM CHLORIDE 0.9 % IR SOLN
Status: DC | PRN
  Administered 2023-10-18: 1000 mL

## 2023-10-18 MED ORDER — ACETAMINOPHEN 500 MG PO TABS
1000.0000 mg | ORAL_TABLET | ORAL | Status: AC
  Administered 2023-10-18: 1000 mg via ORAL
  Filled 2023-10-18: qty 2

## 2023-10-18 MED ORDER — DROPERIDOL 2.5 MG/ML IJ SOLN
INTRAMUSCULAR | Status: AC
  Filled 2023-10-18: qty 2

## 2023-10-18 MED ORDER — CEFAZOLIN SODIUM-DEXTROSE 2-4 GM/100ML-% IV SOLN
2.0000 g | INTRAVENOUS | Status: AC
  Administered 2023-10-18: 2 g via INTRAVENOUS
  Filled 2023-10-18: qty 100

## 2023-10-18 MED ORDER — 0.9 % SODIUM CHLORIDE (POUR BTL) OPTIME
TOPICAL | Status: DC | PRN
  Administered 2023-10-18: 1000 mL

## 2023-10-18 MED ORDER — SCOPOLAMINE 1 MG/3DAYS TD PT72
1.0000 | MEDICATED_PATCH | TRANSDERMAL | Status: DC
  Administered 2023-10-18: 1 mg via TRANSDERMAL
  Filled 2023-10-18: qty 1

## 2023-10-18 MED ORDER — CHLORHEXIDINE GLUCONATE 0.12 % MT SOLN
15.0000 mL | Freq: Once | OROMUCOSAL | Status: AC
  Administered 2023-10-18: 15 mL via OROMUCOSAL
  Filled 2023-10-18: qty 15

## 2023-10-18 MED ORDER — BUPIVACAINE-EPINEPHRINE (PF) 0.25% -1:200000 IJ SOLN
INTRAMUSCULAR | Status: AC
Start: 2023-10-18 — End: 2023-10-18
  Filled 2023-10-18: qty 30

## 2023-10-18 MED ORDER — MIDAZOLAM HCL 2 MG/2ML IJ SOLN
INTRAMUSCULAR | Status: DC | PRN
  Administered 2023-10-18: 2 mg via INTRAVENOUS

## 2023-10-18 MED ORDER — OXYCODONE HCL 5 MG/5ML PO SOLN: 5.0000 mg | Freq: Once | ORAL | Status: AC | PRN

## 2023-10-18 SURGICAL SUPPLY — 39 items
BAG COUNTER SPONGE SURGICOUNT (BAG) ×1 IMPLANT
BLADE CLIPPER SURG (BLADE) IMPLANT
CANISTER SUCTION 3000ML PPV (SUCTIONS) ×1 IMPLANT
CHLORAPREP W/TINT 26 (MISCELLANEOUS) ×1 IMPLANT
CLIP APPLIE 5 13 M/L LIGAMAX5 (MISCELLANEOUS) ×1 IMPLANT
CNTNR URN SCR LID CUP LEK RST (MISCELLANEOUS) IMPLANT
COVER SURGICAL LIGHT HANDLE (MISCELLANEOUS) ×1 IMPLANT
DERMABOND ADVANCED .7 DNX12 (GAUZE/BANDAGES/DRESSINGS) ×1 IMPLANT
DRAPE UTILITY 15X26 TOWEL STRL (DRAPES) IMPLANT
ELECTRODE REM PT RTRN 9FT ADLT (ELECTROSURGICAL) ×1 IMPLANT
GLOVE BIO SURGEON STRL SZ7 (GLOVE) ×1 IMPLANT
GLOVE BIOGEL PI IND STRL 7.5 (GLOVE) ×1 IMPLANT
GOWN STRL REUS W/ TWL LRG LVL3 (GOWN DISPOSABLE) ×3 IMPLANT
GRASPER SUT TROCAR 14GX15 (MISCELLANEOUS) ×1 IMPLANT
HEMOSTAT SNOW SURGICEL 2X4 (HEMOSTASIS) IMPLANT
IRRIGATION SUCT STRKRFLW 2 WTP (MISCELLANEOUS) ×1 IMPLANT
KIT BASIN OR (CUSTOM PROCEDURE TRAY) ×1 IMPLANT
KIT IMAGING PINPOINTPAQ (MISCELLANEOUS) IMPLANT
KIT TURNOVER KIT B (KITS) ×1 IMPLANT
PAD ARMBOARD POSITIONER FOAM (MISCELLANEOUS) ×1 IMPLANT
POUCH RETRIEVAL ECOSAC 10 (ENDOMECHANICALS) ×1 IMPLANT
PREP POVIDONE IODINE SPRAY 2OZ (MISCELLANEOUS) IMPLANT
SCISSORS LAP 5X35 DISP (ENDOMECHANICALS) ×1 IMPLANT
SET TUBE SMOKE EVAC HIGH FLOW (TUBING) ×1 IMPLANT
SLEEVE Z-THREAD 5X100MM (TROCAR) ×2 IMPLANT
SOLN 0.9% NACL 1000 ML (IV SOLUTION) ×1 IMPLANT
SOLN 0.9% NACL POUR BTL 1000ML (IV SOLUTION) ×1 IMPLANT
SOLN STERILE WATER 1000 ML (IV SOLUTION) ×1 IMPLANT
SOLN STERILE WATER BTL 1000 ML (IV SOLUTION) ×1 IMPLANT
SPECIMEN JAR SMALL (MISCELLANEOUS) ×1 IMPLANT
STRIP CLOSURE SKIN 1/2X4 (GAUZE/BANDAGES/DRESSINGS) ×1 IMPLANT
SUT MNCRL AB 4-0 PS2 18 (SUTURE) ×1 IMPLANT
SUT VICRYL 0 UR6 27IN ABS (SUTURE) ×1 IMPLANT
TOWEL GREEN STERILE (TOWEL DISPOSABLE) ×1 IMPLANT
TOWEL GREEN STERILE FF (TOWEL DISPOSABLE) ×1 IMPLANT
TRAY LAPAROSCOPIC MC (CUSTOM PROCEDURE TRAY) ×1 IMPLANT
TROCAR BALLN 12MMX100 BLUNT (TROCAR) ×1 IMPLANT
TROCAR Z-THREAD OPTICAL 5X100M (TROCAR) ×1 IMPLANT
WARMER LAPAROSCOPE (MISCELLANEOUS) ×1 IMPLANT

## 2023-10-18 NOTE — H&P (Signed)
  27 yof otherwise healthy presented to the emergency room earlier this month for her first episode of upper abdominal pain radiating to her back. This is associated with some heartburn as well as emesis. She had some constipation associated with that as well. She underwent evaluation and had normal laboratory evaluation and she had an ultrasound that showed a 1.9 cm gallstone without any evidence of acute cholecystitis. She was discharged home from the emergency room. She has had 1 more episode related to what she ate for dinner since then but is otherwise doing okay. She comes in today to discuss plan  Review of Systems: A complete review of systems was obtained from the patient. I have reviewed this information and discussed as appropriate with the patient. See HPI as well for other ROS.  Review of Systems  HENT: Positive for congestion.  Respiratory: Positive for cough.  Gastrointestinal: Positive for abdominal pain, heartburn, nausea and vomiting.  Psychiatric/Behavioral: The patient is nervous/anxious.  All other systems reviewed and are negative.  Medical History: Past Medical History:  Diagnosis Date  Asthma, unspecified asthma severity, unspecified whether complicated, unspecified whether persistent ([facility]CC)   History reviewed. No pertinent surgical history.   No Known Allergies  Current Outpatient Medications on File Prior to Visit  Medication Sig Dispense Refill  albuterol MDI, PROVENTIL, VENTOLIN, PROAIR, HFA 90 mcg/actuation inhaler Inhale into the lungs  budesonide-formoteroL (SYMBICORT) 80-4.5 mcg/actuation inhaler  cetirizine (ZYRTEC) 10 MG tablet  fluticasone propionate (FLONASE) 50 mcg/actuation nasal spray Place into one nostril  ketotifen (ZADITOR) 0.025 % (0.035 %) ophthalmic solution  naproxen  sodium 220 mg Cap  ondansetron  (ZOFRAN -ODT) 4 MG disintegrating tablet Take 4 mg by mouth every 8 (eight) hours as needed  oxyCODONE -acetaminophen  (PERCOCET) 5-325 mg tablet  Take 1-2 tablets by mouth   Family History  Problem Relation Age of Onset  Obesity Mother  Hyperlipidemia (Elevated cholesterol) Mother    Social History   Tobacco Use  Smoking Status Never  Smokeless Tobacco Never  Marital status: Single  Tobacco Use  Smoking status: Never  Smokeless tobacco: Never  Vaping Use  Vaping status: Unknown  Substance and Sexual Activity  Alcohol use: Never  Drug use: Never    Objective:   Body mass index is 38.22 kg/m.  Physical Exam Vitals reviewed.  Constitutional:  Appearance: Normal appearance.  Eyes:  General: No scleral icterus. Pulmonary:  Effort: Pulmonary effort is normal.  Abdominal:  General: There is no distension.  Palpations: Abdomen is soft.  Tenderness: There is no abdominal tenderness.  Neurological:  Mental Status: She is alert.   Assessment and Plan:   Calculus of gallbladder without cholecystitis without obstruction  Lap chole  I do think symptoms are related to her gb. I discussed the procedure in detail. We discussed the risks and benefits of a laparoscopic cholecystectomy including, but not limited to bleeding, infection, injury to surrounding structures such as the intestine or liver, bile leak, retained gallstones, need to convert to an open procedure, prolonged diarrhea, blood clots such as DVT, common bile duct injury, anesthesia risks, and possible need for additional procedures. We also discussed small risk of subtotal cholecystectomy. The likelihood of improvement in symptoms and return to the patient's normal status is good. We discussed the typical post-operative recovery course.

## 2023-10-18 NOTE — Anesthesia Procedure Notes (Signed)
 Procedure Name: Intubation Date/Time: 10/18/2023 7:42 AM  Performed by: Christopher Comings, CRNAPre-anesthesia Checklist: Patient identified, Emergency Drugs available, Suction available and Patient being monitored Patient Re-evaluated:Patient Re-evaluated prior to induction Oxygen Delivery Method: Circle system utilized Preoxygenation: Pre-oxygenation with 100% oxygen Induction Type: IV induction Ventilation: Mask ventilation without difficulty Laryngoscope Size: Mac and 4 Grade View: Grade I Tube type: Oral Tube size: 7.0 mm Number of attempts: 1 Airway Equipment and Method: Stylet and Oral airway Placement Confirmation: ETT inserted through vocal cords under direct vision, positive ETCO2 and breath sounds checked- equal and bilateral Secured at: 22 cm Tube secured with: Tape Dental Injury: Teeth and Oropharynx as per pre-operative assessment

## 2023-10-18 NOTE — Discharge Instructions (Signed)
 [facility] LAPAROSCOPIC SURGERY: POST OP INSTRUCTIONS  Always review your discharge instruction sheet given to you by the facility where your surgery was performed. IF YOU HAVE DISABILITY OR FAMILY LEAVE FORMS, YOU MUST BRING THEM TO THE OFFICE FOR PROCESSING.   DO NOT GIVE THEM TO YOUR DOCTOR.  A prescription for pain medication may be given to you upon discharge.  Take your pain medication as prescribed, if needed.  If narcotic pain medicine is not needed, then you may take acetaminophen  (Tylenol ), naprosyn (Alleve), or ibuprofen  (Advil ) as needed. Take your usually prescribed medications unless otherwise directed. If you need a refill on your pain medication, please contact your pharmacy.  They will contact our office to request authorization. Prescriptions will not be filled after 5pm or on week-ends. You should follow a light diet the first few days after arrival home, such as soup and crackers, etc.  Be sure to include lots of fluids daily. Most patients will experience some swelling and bruising in the area of the incisions.  Ice packs will help.  Swelling and bruising can take several days to resolve.  It is common to experience some constipation if taking pain medication after surgery.  Increasing fluid intake and taking a stool softener (such as Colace) will usually help or prevent this problem from occurring.  A mild laxative (Milk of Magnesia or Miralax) should be taken according to package instructions if there are no bowel movements after 48 hours. Unless discharge instructions indicate otherwise, you may remove your bandages 48 hours after surgery, and you may shower at that time.  You may have steri-strips (small skin tapes) in place directly over the incision.  These strips should be left on the skin for 7-10 days.  If your surgeon used skin glue on the incision, you may shower in 24 hours.  The glue will flake off over the next  2-3 weeks.  Any sutures or staples will be removed at the office during your follow-up visit. ACTIVITIES:  You may resume regular (light) daily activities beginning the next day--such as daily self-care, walking, climbing stairs--gradually increasing activities as tolerated.  You may have sexual intercourse when it is comfortable.  Refrain from any heavy lifting or straining until approved by your doctor. You may drive when you are no longer taking prescription pain medication, you can comfortably wear a seatbelt, and you can safely maneuver your car and apply brakes. RETURN TO WORK:  __________________________________________________________ Rosine should see your doctor in the office for a follow-up appointment approximately 2-3 weeks after your surgery.  Make sure that you call for this appointment within a day or two after you arrive home to insure a convenient appointment time. OTHER INSTRUCTIONS: __________________________________________________________________________________________________________________________ __________________________________________________________________________________________________________________________ WHEN TO CALL YOUR DOCTOR: Fever over 101.0 Inability to urinate Continued bleeding from incision. Increased pain, redness, or drainage from the incision. Increasing abdominal pain  The clinic staff is available to answer your questions during regular business hours.  Please don't hesitate to call and ask to speak to one of the nurses for clinical concerns.  If you have a medical emergency, go to the nearest emergency room or call 911.  A surgeon from [facility] is always on call at the hospital. [address], [address], [place], [place]  [address] ? [address] [address], [place], [place]   [address] [phone] ? [phone] ? FAX [phone] Web site: [email or web address]

## 2023-10-18 NOTE — Transfer of Care (Signed)
 Immediate Anesthesia Transfer of Care Note  Patient: Cassandra Murray  Procedure(s) Performed: LAPAROSCOPIC CHOLECYSTECTOMY WITH INDOCYANINE GREEN  DYE (Abdomen)  Patient Location: PACU  Anesthesia Type:General  Level of Consciousness: drowsy and patient cooperative  Airway & Oxygen Therapy: Patient Spontanous Breathing  Post-op Assessment: Report given to RN, Post -op Vital signs reviewed and stable, and Patient moving all extremities X 4  Post vital signs: Reviewed and stable  Last Vitals:  Vitals Value Taken Time  BP 110/65 10/18/23 09:03  Temp    Pulse 93 10/18/23 09:04  Resp 21 10/18/23 09:04  SpO2 94 % 10/18/23 09:04  Vitals shown include unfiled device data.  Last Pain:  Vitals:   10/18/23 0631  TempSrc:   PainSc: 0-No pain         Complications: There were no known notable events for this encounter.

## 2023-10-18 NOTE — Interval H&P Note (Signed)
 History and Physical Interval Note:  10/18/2023 7:28 AM  Cassandra Murray  has presented today for surgery, with the diagnosis of GALLSTONES.  The various methods of treatment have been discussed with the patient and family. After consideration of risks, benefits and other options for treatment, the patient has consented to  Procedure(s) with comments: LAPAROSCOPIC CHOLECYSTECTOMY (N/A) - WITH ICG DYE as a surgical intervention.  The patient's history has been reviewed, patient examined, no change in status, stable for surgery.  I have reviewed the patient's chart and labs.  Questions were answered to the patient's satisfaction.     Donnice Bury

## 2023-10-18 NOTE — Anesthesia Preprocedure Evaluation (Addendum)
 Anesthesia Evaluation  Patient identified by MRN, date of birth, ID band Patient awake    Reviewed: Allergy & Precautions, NPO status , Patient's Chart, lab work & pertinent test results  Airway Mallampati: II  TM Distance: >3 FB Neck ROM: Full    Dental  (+) Teeth Intact, Dental Advisory Given   Pulmonary asthma    breath sounds clear to auscultation       Cardiovascular negative cardio ROS  Rhythm:Regular Rate:Normal     Neuro/Psych  Headaches  negative psych ROS   GI/Hepatic Neg liver ROS,GERD  ,,  Endo/Other  negative endocrine ROS    Renal/GU negative Renal ROS     Musculoskeletal negative musculoskeletal ROS (+)    Abdominal   Peds  Hematology negative hematology ROS (+)   Anesthesia Other Findings   Reproductive/Obstetrics                              Anesthesia Physical Anesthesia Plan  ASA: 2  Anesthesia Plan: General   Post-op Pain Management: Tylenol  PO (pre-op)*, Toradol  IV (intra-op)* and Regional block*   Induction: Intravenous  PONV Risk Score and Plan: 4 or greater and Ondansetron , Dexamethasone , Midazolam  and Scopolamine  patch - Pre-op  Airway Management Planned: Oral ETT  Additional Equipment: None  Intra-op Plan:   Post-operative Plan: Extubation in OR  Informed Consent: I have reviewed the patients History and Physical, chart, labs and discussed the procedure including the risks, benefits and alternatives for the proposed anesthesia with the patient or authorized representative who has indicated his/her understanding and acceptance.     Dental advisory given  Plan Discussed with: CRNA  Anesthesia Plan Comments:          Anesthesia Quick Evaluation

## 2023-10-18 NOTE — Anesthesia Procedure Notes (Signed)
 Anesthesia Regional Block: TAP block   Pre-Anesthetic Checklist: , timeout performed,  Correct Patient, Correct Site, Correct Laterality,  Correct Procedure, Correct Position, site marked,  Risks and benefits discussed,  Surgical consent,  Pre-op evaluation,  At surgeon's request and post-op pain management  Laterality: Right  Prep: chloraprep       Needles:  Injection technique: Single-shot  Needle Type: Echogenic Stimulator Needle     Needle Length: 9cm  Needle Gauge: 21     Additional Needles:   Procedures:,,,, ultrasound used (permanent image in chart),,    Narrative:  Start time: 10/18/2023 7:20 AM End time: 10/18/2023 7:23 AM Injection made incrementally with aspirations every 5 mL.  Performed by: Personally  Anesthesiologist: Tilford Franky BIRCH, MD  Additional Notes: Discussed risks and benefits of the nerve block in detail, including but not limited vascular injury, permanent nerve damage and infection.   Patient tolerated the procedure well. Local anesthetic introduced in an incremental fashion under minimal resistance after negative aspirations. No paresthesias were elicited. After completion of the procedure, no acute issues were identified and patient continued to be monitored by RN.

## 2023-10-18 NOTE — Anesthesia Procedure Notes (Signed)
 Anesthesia Regional Block: TAP block   Pre-Anesthetic Checklist: , timeout performed,  Correct Patient, Correct Site, Correct Laterality,  Correct Procedure, Correct Position, site marked,  Risks and benefits discussed,  Surgical consent,  Pre-op evaluation,  At surgeon's request and post-op pain management  Laterality: Left  Prep: chloraprep       Needles:  Injection technique: Single-shot  Needle Type: Echogenic Stimulator Needle     Needle Length: 9cm  Needle Gauge: 21     Additional Needles:   Procedures:,,,, ultrasound used (permanent image in chart),,    Narrative:  Start time: 10/18/2023 7:23 AM End time: 10/18/2023 7:26 AM Injection made incrementally with aspirations every 5 mL.  Performed by: Personally  Anesthesiologist: Tilford Franky BIRCH, MD  Additional Notes: Discussed risks and benefits of the nerve block in detail, including but not limited vascular injury, permanent nerve damage and infection.   Patient tolerated the procedure well. Local anesthetic introduced in an incremental fashion under minimal resistance after negative aspirations. No paresthesias were elicited. After completion of the procedure, no acute issues were identified and patient continued to be monitored by RN.

## 2023-10-18 NOTE — Anesthesia Postprocedure Evaluation (Signed)
 Anesthesia Post Note  Patient: Cassandra Murray  Procedure(s) Performed: LAPAROSCOPIC CHOLECYSTECTOMY WITH INDOCYANINE GREEN  DYE (Abdomen)     Patient location during evaluation: PACU Anesthesia Type: General Level of consciousness: awake and alert Pain management: pain level controlled Vital Signs Assessment: post-procedure vital signs reviewed and stable Respiratory status: spontaneous breathing, nonlabored ventilation, respiratory function stable and patient connected to nasal cannula oxygen Cardiovascular status: blood pressure returned to baseline and stable Postop Assessment: no apparent nausea or vomiting Anesthetic complications: no   There were no known notable events for this encounter.  Last Vitals:  Vitals:   10/18/23 0930 10/18/23 0933  BP: 113/72 111/69  Pulse: 71 72  Resp: 17 18  Temp:  37.1 C  SpO2: 92% 94%    Last Pain:  Vitals:   10/18/23 0933  TempSrc:   PainSc: 4                  Cassandra Murray

## 2023-10-18 NOTE — Op Note (Signed)
  Preoperative diagnosis: Symptomatic cholelithiasis Postoperative diagnosis saa,  chronic cholecystitis Procedure: Laparoscopic cholecystectomy Surgeon: Dr. Adina Bury Anesthesia: General  Complications: None Drains: None Estimated blood loss: <50 cc Specimens: Gallbladder and contents to pathology Sponge count was correct at completion Disposition recovery stable   Indications: 27 yof otherwise healthy presented to the emergency room earlier this month for her first episode of upper abdominal pain radiating to her back. This is associated with some heartburn as well as emesis. She had some constipation associated with that as well. She underwent evaluation and had normal laboratory evaluation and she had an ultrasound that showed a 1.9 cm gallstone without any evidence of acute cholecystitis. She was discharged home from the emergency room. She has had 1 more episode related to what she ate for dinner since then but is otherwise doing okay. We discussed a lap chole.    Procedure: After informed consent was obtained she was taken to the operating room.  She was given antibiotics.  SCDs were placed.  She was placed under anesthesia without complication.  She was prepped and draped in a standard sterile surgical fashion.  A surgical timeout was then performed.   I made a vertical incision below the umbilicus. I incised the fascia and entered the peritoneum bluntly.  I placed a 0 vicryl pursestring suture and inserted a Hasson trocar.     The abdomen was insufflated to 15 mmHg pressure.   I then placed 3 additional 5 mm trocars in the epigastrium and right side of the abdomen under direct vision. She had chronic cholecystitis.  The gallbladder was then retracted cephalad and lateral. This was difficult due to her very stiff liver as well as habitus. The gallbladder was completely full of stones and had significant chronic cholecystitis.   I was then able to dissect the triangle and clearly obtain a  critical view of safety.  I took the gallbladder off the liver bed for some distance just to confirm this.  The green dye was visible in the common duct and the cystic duct confirming my anatomy.I then divided the artery after placing clips leaving two in place. I then placed clips across the duct. I divided the duct leaving two in place. The clips completely traversed the duct and the duct was viable. There were two small posterior arterial branches on the gallbladder that I clipped as well.  One of these was source of bleeding that stopped by completion of case with clip application.  Her gallbladder bed was very friable. I did place two pieces of surgicel snow in the bed. The gallbladder was then placed in a retrieval bag.  I obtained hemostasis. I then removed the gallbladder in the retrieval bag.  I had to enlarge the umbilical incision just to remove the gallbladder.   The bag did rupture during this and I cleansed the incision with betadine as there was minor amount of spillage.  The umbilical  trocar was then removed. I tied my pursestring suture down and then I placed a 3 additional 2-0 Vicryl sutures to completely obliterate the umbilical defect.  I then removed the remaining trocars and desufflated the abdomen.  These were closed with 4-0 Monocryl and glue.  She tolerated this well was extubated and transferred recovery stable

## 2023-10-19 ENCOUNTER — Encounter (HOSPITAL_COMMUNITY): Payer: Self-pay | Admitting: General Surgery

## 2023-10-19 LAB — SURGICAL PATHOLOGY
